# Patient Record
Sex: Male | Born: 2000 | Race: White | Hispanic: No | Marital: Single
Health system: Southern US, Community
[De-identification: ages and names within clinical notes are randomized; demographics above are authoritative.]

---
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
## 2019-02-18 ENCOUNTER — Ambulatory Visit: Payer: No Typology Code available for payment source | Attending: Internal Medicine

## 2019-02-18 DIAGNOSIS — Z20822 Contact with and (suspected) exposure to covid-19: Secondary | ICD-10-CM

## 2019-02-19 LAB — NOVEL CORONAVIRUS, NAA: SARS-CoV-2, NAA: NOT DETECTED
